# Patient Record
Sex: Male | Born: 1955 | Race: White | Hispanic: No | Marital: Single | Smoking: Former smoker
Health system: Southern US, Community
[De-identification: ages and names within clinical notes are randomized; demographics above are authoritative.]

---

## 2020-06-18 ENCOUNTER — Emergency Department (HOSPITAL_COMMUNITY)
Admission: EM | Admit: 2020-06-18 | Discharge: 2020-06-18 | Disposition: A | Discharge status: Home / Self Care | Payer: Managed Care, Other (non HMO) | Attending: Emergency Medicine | Admitting: Emergency Medicine

## 2020-06-18 ENCOUNTER — Other Ambulatory Visit: Payer: Self-pay

## 2020-06-18 ENCOUNTER — Encounter (HOSPITAL_COMMUNITY): Payer: Self-pay

## 2020-06-18 DIAGNOSIS — Z5321 Procedure and treatment not carried out due to patient leaving prior to being seen by health care provider: Secondary | ICD-10-CM | POA: Insufficient documentation

## 2020-06-18 DIAGNOSIS — R339 Retention of urine, unspecified: Secondary | ICD-10-CM | POA: Insufficient documentation

## 2020-06-18 DIAGNOSIS — R300 Dysuria: Secondary | ICD-10-CM | POA: Insufficient documentation

## 2020-06-18 DIAGNOSIS — R109 Unspecified abdominal pain: Secondary | ICD-10-CM | POA: Insufficient documentation

## 2020-06-18 LAB — CBC
HCT: 38 % — ABNORMAL LOW (ref 39.0–52.0)
Hemoglobin: 12.9 g/dL — ABNORMAL LOW (ref 13.0–17.0)
MCH: 30.4 pg (ref 26.0–34.0)
MCHC: 33.9 g/dL (ref 30.0–36.0)
MCV: 89.6 fL (ref 80.0–100.0)
Platelets: 300 10*3/uL (ref 150–400)
RBC: 4.24 MIL/uL (ref 4.22–5.81)
RDW: 12.9 % (ref 11.5–15.5)
WBC: 8.9 10*3/uL (ref 4.0–10.5)
nRBC: 0 % (ref 0.0–0.2)

## 2020-06-18 LAB — URINALYSIS, ROUTINE W REFLEX MICROSCOPIC
Bilirubin Urine: NEGATIVE
Glucose, UA: NEGATIVE mg/dL
Hgb urine dipstick: NEGATIVE
Ketones, ur: NEGATIVE mg/dL
Nitrite: NEGATIVE
Protein, ur: 100 mg/dL — AB
Specific Gravity, Urine: 1.02 (ref 1.005–1.030)
WBC, UA: >50 WBC/hpf — ABNORMAL HIGH (ref 0–5)
pH: 5 (ref 5.0–8.0)

## 2020-06-18 LAB — BASIC METABOLIC PANEL
Anion gap: 9 (ref 5–15)
BUN: 16 mg/dL (ref 8–23)
CO2: 25 mmol/L (ref 22–32)
Calcium: 9.1 mg/dL (ref 8.9–10.3)
Chloride: 107 mmol/L (ref 98–111)
Creatinine, Ser: 1.26 mg/dL — ABNORMAL HIGH (ref 0.61–1.24)
GFR, Estimated: >60 mL/min (ref >60)
Glucose, Bld: 137 mg/dL — ABNORMAL HIGH (ref 70–99)
Potassium: 4.3 mmol/L (ref 3.5–5.1)
Sodium: 141 mmol/L (ref 135–145)

## 2020-06-18 NOTE — ED Triage Notes (Signed)
Pt reports 1 week of dysuria and rentention, pt only able to get out dribbles of urine at a time, pt also reports groin tenderness, has apt for a prostate exam later this month

## 2020-06-18 NOTE — ED Notes (Signed)
Pt stated that "6 hours is long enough." Pt visualized walking out and leaving."

## 2020-06-19 ENCOUNTER — Ambulatory Visit (HOSPITAL_COMMUNITY)
Admission: RE | Admit: 2020-06-19 | Discharge: 2020-06-19 | Disposition: A | Discharge status: Home / Self Care | Payer: Managed Care, Other (non HMO) | Source: Ambulatory Visit | Attending: Urgent Care | Admitting: Urgent Care

## 2020-06-19 ENCOUNTER — Other Ambulatory Visit: Payer: Self-pay

## 2020-06-19 ENCOUNTER — Encounter (HOSPITAL_COMMUNITY): Payer: Self-pay

## 2020-06-19 VITALS — BP 122/70 | HR 79 | Temp 98.5°F | Resp 18

## 2020-06-19 DIAGNOSIS — N3001 Acute cystitis with hematuria: Secondary | ICD-10-CM

## 2020-06-19 DIAGNOSIS — R339 Retention of urine, unspecified: Secondary | ICD-10-CM | POA: Diagnosis not present

## 2020-06-19 LAB — POCT URINALYSIS DIPSTICK, ED / UC
Bilirubin Urine: NEGATIVE
Glucose, UA: NEGATIVE mg/dL
Ketones, ur: NEGATIVE mg/dL
Nitrite: NEGATIVE
Protein, ur: 100 mg/dL — AB
Specific Gravity, Urine: >=1.03 (ref 1.005–1.030)
Urobilinogen, UA: 0.2 mg/dL (ref 0.0–1.0)
pH: 6 (ref 5.0–8.0)

## 2020-06-19 MED ORDER — NITROFURANTOIN MONOHYD MACRO 100 MG PO CAPS: 100.0000 mg | ORAL_CAPSULE | Freq: Two times a day (BID) | ORAL | 0 refills | Status: DC

## 2020-06-19 NOTE — ED Provider Notes (Signed)
[place]    CSN: [id number] Arrival date & time: 06/19/20  1105      History   Chief Complaint Chief Complaint  Patient presents with  . Urinary Retention    HPI Nathan Pearson is a 65 y.o. male presenting with urinary retention x1 week. He denies history of BPH though he endorses one year of weak stream and family history of BPH.  Patient endorses 1 week of increased difficulty with urination and pressure over his bladder with urinating.  He endorses sensation of incomplete voiding.  Denies other UTI symptoms including dysuria hematuria frequency urgency back pain fevers/chills. Denies back pain. Denies new partners/STI risk.  HPI  History reviewed. No pertinent past medical history.       Home Medications    Prior to Admission medications   Medication Sig Start Date End Date Taking? Authorizing Provider  nitrofurantoin, macrocrystal-monohydrate, (MACROBID) 100 MG capsule Take 1 capsule (100 mg total) by mouth 2 (two) times daily. 06/19/20  Yes Hazel Sams, PA-C    Family History History reviewed. No pertinent family history.  Social History     Allergies   Penicillins   Review of Systems Review of Systems  Constitutional: Negative for appetite change, chills, diaphoresis and fever.  Respiratory: Negative for shortness of breath.   Cardiovascular: Negative for chest pain.  Gastrointestinal: Negative for abdominal pain, blood in stool, constipation, diarrhea, nausea and vomiting.  Genitourinary: Positive for difficulty urinating. Negative for decreased urine volume, dysuria, enuresis, flank pain, frequency, genital sores, hematuria, penile discharge, penile pain, penile swelling, scrotal swelling, testicular pain and urgency.  Musculoskeletal: Negative for back pain.  Neurological: Negative for dizziness, weakness and light-headedness.  All other systems reviewed and are negative.    Physical Exam Triage Vital Signs ED Triage Vitals  Enc  Vitals Group     BP 06/19/20 1143 122/70     Pulse Rate 06/19/20 1143 79     Resp 06/19/20 1143 18     Temp 06/19/20 1143 98.5 F (36.9 C)     Temp src --      SpO2 06/19/20 1143 99 %     Weight --      Height --      Head Circumference --      Peak Flow --      Pain Score 06/19/20 1144 7     Pain Loc --      Pain Edu? --      Excl. in [place]? --    No data found.  Updated Vital Signs BP 122/70 (BP Location: Left Arm)   Pulse 79   Temp 98.5 F (36.9 C)   Resp 18   SpO2 99%   Visual Acuity Right Eye Distance:   Left Eye Distance:   Bilateral Distance:    Right Eye Near:   Left Eye Near:    Bilateral Near:     Physical Exam Vitals reviewed.  Constitutional:      General: He is not in acute distress.    Appearance: Normal appearance. He is not ill-appearing.  HENT:     Head: Normocephalic and atraumatic.  Cardiovascular:     Rate and Rhythm: Normal rate and regular rhythm.     Heart sounds: Normal heart sounds.  Pulmonary:     Effort: Pulmonary effort is normal.     Breath sounds: Normal breath sounds.  Abdominal:     General: Bowel sounds are normal. There is no distension.  Palpations: Abdomen is soft. There is no mass.     Tenderness: There is no abdominal tenderness. There is no right CVA tenderness, left CVA tenderness, guarding or rebound. Negative signs include Murphy's sign, Rovsing's sign and McBurney's sign.  Neurological:     General: No focal deficit present.     Mental Status: He is alert and oriented to person, place, and time. Mental status is at baseline.  Psychiatric:        Mood and Affect: Mood normal.        Behavior: Behavior normal.        Thought Content: Thought content normal.        Judgment: Judgment normal.      UC Treatments / Results  Labs (all labs ordered are listed, but only abnormal results are displayed) Labs Reviewed  POCT URINALYSIS DIPSTICK, ED / UC - Abnormal; Notable for the following components:      Result  Value   Hgb urine dipstick MODERATE (*)    Protein, ur 100 (*)    Leukocytes,Ua LARGE (*)    All other components within normal limits  URINE CULTURE    EKG   Radiology No results found.  Procedures Procedures (including critical care time)  Medications Ordered in UC Medications - No data to display  Initial Impression / Assessment and Plan / UC Course  I have reviewed the triage vital signs and the nursing notes.  Pertinent labs & imaging results that were available during my care of the patient were reviewed by me and considered in my medical decision making (see chart for details).     UA with moderate blood , large leuk. Plan to treat for UTI with macrobid as below. Culture sent.  Patient already has appt with PCP to evaluate possible BPH; follow-up with them as scholed.  Return precautions- chest pain, shortness of breath, new/worsening fevers/chills, confusion, worsening of symptoms despite the above treatment plan, etc.   Spent over 30 minutes obtaining H&P, performing physical, discussing results, treatment plan and plan for follow-up with patient. Patient agrees with plan.      Final Clinical Impressions(s) / UC Diagnoses   Final diagnoses:  Acute cystitis with hematuria     Discharge Instructions     -Macrobid twice daily for 5 days.  -We'll call you if we need to change the antibiotic.  -Come back and see [place] if your symptoms worsen or persist despite treatment- like new/worsening fevers, worsening abdominal pain, back pain, etc.     ED Prescriptions    Medication Sig Dispense Auth. Provider   nitrofurantoin, macrocrystal-monohydrate, (MACROBID) 100 MG capsule Take 1 capsule (100 mg total) by mouth 2 (two) times daily. 10 capsule Hazel Sams, PA-C     PDMP not reviewed this encounter.   Hazel Sams, PA-C 06/19/20 1326

## 2020-06-19 NOTE — Discharge Instructions (Signed)
-  Macrobid twice daily for 5 days.  -We'll call you if we need to change the antibiotic.  -Come back and see [place] if your symptoms worsen or persist despite treatment- like new/worsening fevers, worsening abdominal pain, back pain, etc.

## 2020-06-19 NOTE — ED Triage Notes (Signed)
Pt present unable to urinate, pt states when he goes it aches and he is not emptying his bladder completely with some pressure.

## 2020-06-21 ENCOUNTER — Encounter (HOSPITAL_COMMUNITY): Payer: Self-pay

## 2020-06-21 LAB — URINE CULTURE: Culture: >=100000 — AB

## 2020-08-05 ENCOUNTER — Other Ambulatory Visit: Payer: Self-pay | Admitting: Otolaryngology

## 2020-08-05 DIAGNOSIS — R221 Localized swelling, mass and lump, neck: Secondary | ICD-10-CM

## 2020-08-19 ENCOUNTER — Other Ambulatory Visit: Payer: Self-pay

## 2020-08-19 ENCOUNTER — Encounter: Payer: Self-pay | Admitting: Pulmonary Disease

## 2020-08-19 ENCOUNTER — Ambulatory Visit: Payer: Managed Care, Other (non HMO) | Admitting: Pulmonary Disease

## 2020-08-19 VITALS — BP 122/74 | HR 84 | Temp 97.6°F | Ht 70.0 in | Wt 195.8 lb

## 2020-08-19 DIAGNOSIS — R0600 Dyspnea, unspecified: Secondary | ICD-10-CM

## 2020-08-19 DIAGNOSIS — R059 Cough, unspecified: Secondary | ICD-10-CM | POA: Diagnosis not present

## 2020-08-19 DIAGNOSIS — R0609 Other forms of dyspnea: Secondary | ICD-10-CM

## 2020-08-19 NOTE — Progress Notes (Signed)
@Patient  ID: Nathan Pearson, male    DOB: [birth year], 65 y.o.   MRN: [id number]  Chief Complaint  Patient presents with  . Consult    Covid 2020, Feb 2021 got first vaccine and started losing voice.    Referring provider: No ref. provider found  HPI:   65 year old whom we are seeing for evaluation of voice change, dyspnea exertion.  ENT note reviewed.  Patient has got Covid vaccine fall 2021.  About 2 weeks later he noticed voice change.  Smoking is made his voice deeper but there is been noticeable difference in quality to his voice at that time.  He was seen by ENT 04/2020.  They did a laryngoscopy fiberoptic exam which showed normal vocal cord function, no nodules, masses, etc.  He reported back a second time and a second look showed similar findings.  No explanation for voice changes.  This visit prompted a CT neck with contrast scheduled for next week.  He does endorse a cough.  Is intermittent.  Made it worse in the morning.  Producing sputum.  Feels like it comes from his chest.  No alleviating or exacerbating factors.  No seasonal or environmental changes.  His internal exertion that been present for some time but he thinks a bit worse since onset of voice changes as above.  Worse with inclines or stairs.  Relieved with rest.  No timing during day were better or worse.  No seasonal environmental changes.  No alleviating or exacerbating factors of the identified.  PMH: BPH Surgical history: Reviewed, he denies any surgeries Family history: Reviewed with patient, significant respiratory issues in first-degree relatives Social street: Former smoker, 40 pack years, quit November 2021, works as a Personal assistant, grew up in [place] / Pulmonary Flowsheets:   ACT:  No flowsheet data found.  MMRC: No flowsheet data found.  Epworth:  No flowsheet data found.  Tests:   FENO:  No results found for: NITRICOXIDE  PFT: No flowsheet data found.  WALK:   No flowsheet data found.  Imaging: n/a  Lab Results: Personally reviewed, borderline hemoglobin but no explanation for dyspnea CBC    Component Value Date/Time   WBC 8.9 06/18/2020 1445   RBC 4.24 06/18/2020 1445   HGB 12.9 (L) 06/18/2020 1445   HCT 38.0 (L) 06/18/2020 1445   PLT 300 06/18/2020 1445   MCV 89.6 06/18/2020 1445   MCH 30.4 06/18/2020 1445   MCHC 33.9 06/18/2020 1445   RDW 12.9 06/18/2020 1445    BMET    Component Value Date/Time   NA 141 06/18/2020 1445   K 4.3 06/18/2020 1445   CL 107 06/18/2020 1445   CO2 25 06/18/2020 1445   GLUCOSE 137 (H) 06/18/2020 1445   BUN 16 06/18/2020 1445   CREATININE 1.26 (H) 06/18/2020 1445   CALCIUM 9.1 06/18/2020 1445   GFRNONAA >60 06/18/2020 1445    BNP No results found for: BNP  ProBNP No results found for: PROBNP  Specialty Problems   None     Allergies  Allergen Reactions  . Penicillins      There is no immunization history on file for this patient.  History reviewed. No pertinent past medical history.  Tobacco History: Social History   Tobacco Use  Smoking Status Former Smoker  . Quit date: 03/2020  . Years since quitting: 0.4  Smokeless Tobacco Never Used   Counseling given: No   Continue to not smoke  Outpatient Encounter Medications as of  08/19/2020  Medication Sig  . ipratropium (ATROVENT) 0.06 % nasal spray SMARTSIG:2 Spray(s) Both Nares Twice Daily PRN  . nitrofurantoin, macrocrystal-monohydrate, (MACROBID) 100 MG capsule Take 1 capsule (100 mg total) by mouth 2 (two) times daily.  . tamsulosin (FLOMAX) 0.4 MG CAPS capsule Take 0.4 mg by mouth daily.   No facility-administered encounter medications on file as of 08/19/2020.     Review of Systems  Review of Systems  No chest pain exertion.  No orthopnea or PND.  Comprehensive review of systems otherwise negative Physical Exam  BP 122/74 (BP Location: Left Arm, Cuff Size: Normal)   Pulse 84   Temp 97.6 F (36.4 C) (Oral)    Ht 5\' 10"  (1.778 m)   Wt 195 lb 12.8 oz (88.8 kg)   SpO2 94%   BMI 28.09 kg/m   Wt Readings from Last 5 Encounters:  08/19/20 195 lb 12.8 oz (88.8 kg)    BMI Readings from Last 5 Encounters:  08/19/20 28.09 kg/m     Physical Exam General: Well-appearing, no acute distress Eyes: EOMI, no gross Neck: Supple, no JVP Neuro: Cranial nerves intact, muffled almost a lisp like voice is deep, mild dysphasia, maybe mild droop on the left but no nasolabial fold flattening or other abnormality of facial nerve Pulmonary: Distant breath sounds, clear to auscultate bilaterally Cardiovascular: frequent PVCs, regular rate, warm Abdomen: Nondistended, bowel sounds present MSK: No synovitis, effusion Psych: Normal mood, full affect   Assessment & Plan:   Voice change: About 2 weeks after COVID-19 vaccination.  Has seen ENT x2 with vocal cords reportedly normal per patient report.  Reviewed ENT note 12/27 confirms this.  Investigate extrinsic issues with CT neck in coming days.  Do worry a bit about stroke given the sound of his voice but would have to be extremely focal, lacunar given no other deficits seen on cranial nerve or other exam.  Dyspnea exertion: Possibilities of vocal cord issues but this seems to be ruled out by ENT evaluation x2.  He has a 25-30-pack-year smoking history.  Distant breath sounds.  High suspicion for early emphysema/COPD.  Will obtain PFTs in the coming days.  In addition, CT chest ordered given sputum production as well as voice change, evaluate potential underlying mass or other structural issue below the vocal cords.   Return in about 6 weeks (around 09/30/2020).   Lanier Clam, MD 08/19/2020

## 2020-08-19 NOTE — Patient Instructions (Addendum)
Nice to meet you  I think I need some more information to decide how best to help you  To start this, I have asked for a CT scan of your chest.  Hopefully this can be added to the already scheduled CT scan of your neck on 08/23/2020.  In addition, I ordered breathing or pulmonary function test.  This will give me insight into other reasons you may be short of breath.  I do worry a little bit of emphysema and COPD given your cigarette smoking and your exam today.  But I cannot diagnose any of this without the above tests.  Return to clinic in 6 weeks with Dr. Silas Flood  Schedule PFTs next available at patient convenience

## 2020-08-23 ENCOUNTER — Ambulatory Visit
Admission: RE | Admit: 2020-08-23 | Discharge: 2020-08-23 | Disposition: A | Discharge status: Home / Self Care | Payer: Managed Care, Other (non HMO) | Source: Ambulatory Visit | Attending: Otolaryngology | Admitting: Otolaryngology

## 2020-08-23 ENCOUNTER — Telehealth: Payer: Self-pay | Admitting: Pulmonary Disease

## 2020-08-23 ENCOUNTER — Other Ambulatory Visit: Payer: Managed Care, Other (non HMO)

## 2020-08-23 ENCOUNTER — Other Ambulatory Visit: Payer: Self-pay

## 2020-08-23 DIAGNOSIS — R221 Localized swelling, mass and lump, neck: Secondary | ICD-10-CM

## 2020-08-23 DIAGNOSIS — R059 Cough, unspecified: Secondary | ICD-10-CM

## 2020-08-23 MED ORDER — IOPAMIDOL (ISOVUE-300) INJECTION 61%
75.0000 mL | Freq: Once | INTRAVENOUS | Status: AC | PRN
  Administered 2020-08-23: 75 mL via INTRAVENOUS

## 2020-08-23 NOTE — Telephone Encounter (Signed)
I had called to set up a Peer to Peer and this is what I was told Case [id number]

## 2020-08-23 NOTE — Telephone Encounter (Signed)
Mh please advise. Thanks

## 2020-08-23 NOTE — Telephone Encounter (Signed)
Vallarie Mare please advise on the number that Dr. Silas Flood can call to do the Peer to Peer.  thanks

## 2020-08-23 NOTE — Telephone Encounter (Signed)
What is the peer to peer for? I can likely call tomorrow. What is the number to call?

## 2020-08-24 NOTE — Telephone Encounter (Signed)
When I called to do it they said a peer to peer would not help it would still be denied they need a chest xray result and a pft result before it would be considered I have the number I called was [phone] opt 4. The case # is [id number]

## 2020-08-24 NOTE — Telephone Encounter (Signed)
Routing to Dr. Silas Flood

## 2020-08-25 NOTE — Telephone Encounter (Signed)
OK can we order a CXR and check on status of PFT schedule since need both before can pursue a CT chest?

## 2020-08-25 NOTE — Telephone Encounter (Signed)
I have called the pt and he is coming tomorrow for his cxr and he is scheduled for 04/29 for the PFT.  He will be going on 04/26 to get his covid test.  Pt is aware. FYI for MH.

## 2020-08-26 ENCOUNTER — Ambulatory Visit (INDEPENDENT_AMBULATORY_CARE_PROVIDER_SITE_OTHER): Payer: Managed Care, Other (non HMO)

## 2020-08-26 DIAGNOSIS — R059 Cough, unspecified: Secondary | ICD-10-CM

## 2020-09-07 ENCOUNTER — Other Ambulatory Visit (HOSPITAL_COMMUNITY)
Admission: RE | Admit: 2020-09-07 | Discharge: 2020-09-07 | Disposition: A | Discharge status: Home / Self Care | Payer: Managed Care, Other (non HMO) | Source: Ambulatory Visit | Attending: Pulmonary Disease | Admitting: Pulmonary Disease

## 2020-09-07 DIAGNOSIS — Z20822 Contact with and (suspected) exposure to covid-19: Secondary | ICD-10-CM | POA: Insufficient documentation

## 2020-09-07 DIAGNOSIS — Z01812 Encounter for preprocedural laboratory examination: Secondary | ICD-10-CM | POA: Diagnosis present

## 2020-09-08 LAB — SARS CORONAVIRUS 2 (TAT 6-24 HRS): SARS Coronavirus 2: NEGATIVE

## 2020-09-10 ENCOUNTER — Other Ambulatory Visit: Payer: Self-pay

## 2020-09-10 ENCOUNTER — Ambulatory Visit (INDEPENDENT_AMBULATORY_CARE_PROVIDER_SITE_OTHER): Payer: Managed Care, Other (non HMO) | Admitting: Pulmonary Disease

## 2020-09-10 DIAGNOSIS — R059 Cough, unspecified: Secondary | ICD-10-CM

## 2020-10-05 LAB — PULMONARY FUNCTION TEST
DL/VA % pred: 108 %
DL/VA: 4.51 ml/min/mmHg/L
DLCO cor % pred: 69 %
DLCO cor: 18.05 ml/min/mmHg
DLCO unc % pred: 69 %
DLCO unc: 18.05 ml/min/mmHg
FEF 25-75 Post: 0.91 L/sec
FEF 25-75 Pre: 0.86 L/sec
FEF2575-%Change-Post: 6 %
FEF2575-%Pred-Post: 35 %
FEF2575-%Pred-Pre: 33 %
FEV1-%Change-Post: 0 %
FEV1-%Pred-Post: 43 %
FEV1-%Pred-Pre: 43 %
FEV1-Post: 1.43 L
FEV1-Pre: 1.42 L
FEV1FVC-%Change-Post: 4 %
FEV1FVC-%Pred-Pre: 90 %
FEV6-%Change-Post: -5 %
FEV6-%Pred-Post: 47 %
FEV6-%Pred-Pre: 50 %
FEV6-Post: 1.99 L
FEV6-Pre: 2.1 L
FEV6FVC-%Change-Post: -1 %
FEV6FVC-%Pred-Post: 103 %
FEV6FVC-%Pred-Pre: 105 %
FVC-%Change-Post: -3 %
FVC-%Pred-Post: 46 %
FVC-%Pred-Pre: 47 %
FVC-Post: 2.03 L
FVC-Pre: 2.11 L
Post FEV1/FVC ratio: 70 %
Post FEV6/FVC ratio: 98 %
Pre FEV1/FVC ratio: 67 %
Pre FEV6/FVC Ratio: 100 %

## 2020-10-20 ENCOUNTER — Other Ambulatory Visit (HOSPITAL_COMMUNITY): Payer: Self-pay | Admitting: Urology

## 2020-10-20 ENCOUNTER — Other Ambulatory Visit: Payer: Self-pay | Admitting: Urology

## 2020-10-20 DIAGNOSIS — C61 Malignant neoplasm of prostate: Secondary | ICD-10-CM

## 2020-10-29 NOTE — Progress Notes (Addendum)
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 Reported urinary frequency, intermittency, hesitancy, weak stream, straining to void, and gross hematuria. In 07/2020 he reported his symptoms improved after starting Flomax, and CT IVP was negative for malignancy.   Biopsies revealed:  09/24/2020   Past/Anticipated interventions by urology, if any:  09/24/2020 Dr. Link Snuffer Transrectal ultrasound and prostate biopsy  Past/Anticipated interventions by medical oncology, if any:  No referral placed at this time  IPSS Score: 18 (moderate) SHIM Score: 9 (moderate)  Weight changes, if any: Reports an unintentional weight loss ~10 lbs over the past few months. States it seems related to the adverse reaction he experienced after the receiving the first Pfizer vaccine (change in vocal tone/strength, and difficulty chewing/swallowing). Performed a pharyngeal function test on 10/28/2020 at [facility]  Bowel/Bladder complaints, if any: Patient denies. Reports he has regular, daily bowel movements.    Nausea/Vomiting, if any: Patient denies  Pain issues, if any:  Patient denies  SAFETY ISSUES: Prior radiation? No Pacemaker/ICD? No Possible current pregnancy? N/A Is the patient on methotrexate? No  Current Complaints / other details:  Scheduled for a neurology evaluation on 11/11/2020.

## 2020-10-31 NOTE — Progress Notes (Signed)
Radiation Oncology         [phone] ________________________________  Initial Outpatient Consultation  Name: Nathan Pearson MRN: [id number]  Date: 11/01/2020  DOB: 09/11/1955  CC:Pcp, No  Nathan Pearson Nathan Hane, MD   REFERRING PHYSICIAN: Lucas Mallow, MD  DIAGNOSIS: 65 y.o. 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    ICD-10-CM   1. Malignant neoplasm of prostate ([place])  C61       HISTORY OF PRESENT ILLNESS: Nathan Pearson is a 65 y.o. male with a diagnosis of prostate cancer. He initially presented to urgent care with LUTS and hematuria. He was found to have a UTI and started on Macrobid. Accordingly, he was referred for evaluation in urology by Dr. Gloriann Pearson on 07/09/20.  PSA performed that day showed elevation at 5.04. He underwent CT A/P on 07/23/20 to evaluate for hematuria showing: no evidence urinary tract mass, calculus, filling defect, or hydronephrosis; mild prostatomegaly; no enlarged lymph nodes. Repeat PSA on 08/05/20 showed a slight decrease to 4.38. Cystoscopy performed at follow up on 08/12/20 showed only an obstructing prostate with mild hyperplasia. The patient proceeded to transrectal ultrasound with 12 biopsies of the prostate on 09/24/20.  The prostate volume measured 65 cc.  Out of 12 core biopsies, all 12 were positive.  The maximum Gleason score was 4+4, and this was seen in left apex lateral. Additionally, Gleason 4+3 was seen in left base and left mid, Gleason 3+4 in right apex lateral (small focus), left mid lateral, and left mid lateral, and Gleason 3+3 in left apex (small focus), right base (small focus), right mid (small focus), right apex, right base lateral, and right mid lateral.  He is scheduled for bone scan on 11/02/20.  It should be noted that concurrent with his work-up for prostate cancer, the patient has also developed profound dysarthria.  The onset seemed to correlate with his first COVID  vaccine.  He has seen ENT at [facility] and had barium swallow.  The ENT referred him to neurology for consideration of a central neurologic etiology including possible ALS.  He meets with neurology on 11/11/20.  The patient reviewed the biopsy results with his urologist and he has kindly been referred today for discussion of potential radiation treatment options.   PREVIOUS RADIATION THERAPY: No  PAST MEDICAL HISTORY: No past medical history on file.    PAST SURGICAL HISTORY:No past surgical history on file.  FAMILY HISTORY: No family history on file.  SOCIAL HISTORY:  Social History   Socioeconomic History   Marital status: Single    Spouse name: Not on file   Number of children: Not on file   Years of education: Not on file   Highest education level: Not on file  Occupational History   Not on file  Tobacco Use   Smoking status: Former    Pack years: 0.00    Types: Cigarettes    Quit date: 03/2020    Years since quitting: 0.6   Smokeless tobacco: Never  Substance and Sexual Activity   Alcohol use: Not on file   Drug use: Not on file   Sexual activity: Not on file  Other Topics Concern   Not on file  Social History Narrative   ** Merged History Encounter **       Social Determinants of Health   Financial Resource Strain: Not on file  Food Insecurity: Not on file  Transportation Needs:  Not on file  Physical Activity: Not on file  Stress: Not on file  Social Connections: Not on file  Intimate Partner Violence: Not on file    ALLERGIES: Penicillins  MEDICATIONS:  Current Outpatient Medications  Medication Sig Dispense Refill   tamsulosin (FLOMAX) 0.4 MG CAPS capsule Take 0.4 mg by mouth daily.     ipratropium (ATROVENT) 0.06 % nasal spray SMARTSIG:2 Spray(s) Both Nares Twice Daily PRN (Patient not taking: Reported on 11/01/2020)     nitrofurantoin, macrocrystal-monohydrate, (MACROBID) 100 MG capsule Take 1 capsule (100 mg total) by mouth 2 (two) times daily.  (Patient not taking: Reported on 11/01/2020) 10 capsule 0   No current facility-administered medications for this encounter.    REVIEW OF SYSTEMS:  On review of systems, the patient reports that he is doing well overall. He denies any chest pain, shortness of breath, cough, fevers, chills, night sweats, unintended weight changes. He denies any bowel disturbances, and denies abdominal pain, nausea or vomiting. He denies any new musculoskeletal or joint aches or pains. His IPSS was 18, indicating severe urinary symptoms. His SHIM was 6, indicating he does have moderate to severe erectile dysfunction. A complete review of systems is obtained and is otherwise negative.    PHYSICAL EXAM:  Wt Readings from Last 3 Encounters:  11/01/20 183 lb (83 kg)  08/19/20 195 lb 12.8 oz (88.8 kg)   Temp Readings from Last 3 Encounters:  11/01/20 97.9 F (36.6 C)  08/19/20 97.6 F (36.4 C) (Oral)  06/19/20 98.5 F (36.9 C)   BP Readings from Last 3 Encounters:  11/01/20 123/70  08/19/20 122/74  06/19/20 122/70   Pulse Readings from Last 3 Encounters:  11/01/20 77  08/19/20 84  06/19/20 79   Pain Assessment Pain Score: 0-No pain/10  In general this is a well appearing man in no acute distress. He's alert and oriented x4 and appropriate throughout the examination.  He is profoundly dysarthric but otherwise neuro grossly intact. Cardiopulmonary assessment is negative for acute distress, and he exhibits normal effort.     KPS = 100  100 - Normal; no complaints; no evidence of disease. 90   - Able to carry on normal activity; minor signs or symptoms of disease. 80   - Normal activity with effort; some signs or symptoms of disease. 20   - Cares for self; unable to carry on normal activity or to do active work. 60   - Requires occasional assistance, but is able to care for most of his personal needs. 50   - Requires considerable assistance and frequent medical care. 66   - Disabled; requires special  care and assistance. 15   - Severely disabled; hospital admission is indicated although death not imminent. 64   - Very sick; hospital admission necessary; active supportive treatment necessary. 10   - Moribund; fatal processes progressing rapidly. 0     - Dead  Karnofsky DA, Abelmann Samak, Craver LS and Burchenal [facility] [phone]) The use of the nitrogen mustards in the palliative treatment of carcinoma: with particular reference to bronchogenic carcinoma Cancer 1 634-56  LABORATORY DATA:  Lab Results  Component Value Date   WBC 8.9 06/18/2020   HGB 12.9 (L) 06/18/2020   HCT 38.0 (L) 06/18/2020   MCV 89.6 06/18/2020   PLT 300 06/18/2020   Lab Results  Component Value Date   NA 141 06/18/2020   K 4.3 06/18/2020   CL 107 06/18/2020   CO2 25 06/18/2020   No results  found for: ALT, AST, GGT, ALKPHOS, BILITOT   RADIOGRAPHY: No results found.    IMPRESSION/PLAN: 1. 65 y.o. gentleman with Stage T1c adenocarcinoma of the prostate with Gleason Score of 4+4, and PSA of 4.38. We discussed the patient's workup and outlined the nature of prostate cancer in this setting. The patient's T stage, Gleason's score, and PSA put him into the high risk group. Accordingly, he is eligible for a variety of potential treatment options including ADT in combination with 8 weeks of external radiation, 5 weeks of external radiation with an upfront brachytherapy boost, or prostatectomy. We discussed the available radiation techniques, and focused on the details and logistics of delivery. The patient may not be an ideal candidate for brachytherapy boost with a prostate volume of 65 cc prior to downsizing from hormone therapy. We discussed that based on his prostate volume, he would require beginning treatment with a 5 alpha reductase inhibitor and ADT for at least 3 months to allow for downsizing of the prostate prior to initiating radiotherapy. We discussed and outlined the risks, benefits, short and long-term effects  associated with radiotherapy and compared and contrasted these with prostatectomy. We discussed the role of SpaceOAR gel in reducing the rectal toxicity associated with radiotherapy. We also detailed the role of ADT in the treatment of high risk prostate cancer and outlined the associated side effects that could be expected with this therapy. He appears to have a good understanding of his disease and our treatment recommendations which are of curative intent.  He was encouraged to ask questions that were answered to his stated satisfaction.  At the conclusion of our conversation, the patient is most interested in moving forward with prostatectomy, pending his neurology evaluation.   I personally spent 60 minutes in this encounter including chart review, reviewing radiological studies, meeting face-to-face with the patient, entering orders and completing documentation.    ------------------------------------------------   Tyler Pita, MD [place]: [phone]  Fax: [phone] .com  Skype  [email or web address]   This document serves as a record of services personally performed by Tyler Pita, MD. It was created on his behalf by Wilburn Mylar, a trained medical scribe. The creation of this record is based on the scribe's personal observations and the provider's statements to them. This document has been checked and approved by the attending provider.

## 2020-11-01 ENCOUNTER — Ambulatory Visit
Admission: RE | Admit: 2020-11-01 | Discharge: 2020-11-01 | Disposition: A | Discharge status: Home / Self Care | Payer: Managed Care, Other (non HMO) | Source: Ambulatory Visit | Attending: Radiation Oncology | Admitting: Radiation Oncology

## 2020-11-01 ENCOUNTER — Other Ambulatory Visit: Payer: Self-pay

## 2020-11-01 VITALS — BP 123/70 | HR 77 | Temp 97.9°F | Resp 18 | Ht 70.0 in | Wt 183.0 lb

## 2020-11-01 DIAGNOSIS — R471 Dysarthria and anarthria: Secondary | ICD-10-CM | POA: Diagnosis not present

## 2020-11-01 DIAGNOSIS — C61 Malignant neoplasm of prostate: Secondary | ICD-10-CM | POA: Insufficient documentation

## 2020-11-02 ENCOUNTER — Encounter (HOSPITAL_COMMUNITY)
Admission: RE | Admit: 2020-11-02 | Discharge: 2020-11-02 | Disposition: A | Discharge status: Home / Self Care | Payer: Managed Care, Other (non HMO) | Source: Ambulatory Visit | Attending: Urology | Admitting: Urology

## 2020-11-02 DIAGNOSIS — C61 Malignant neoplasm of prostate: Secondary | ICD-10-CM

## 2020-11-02 MED ORDER — TECHNETIUM TC 99M MEDRONATE IV KIT
20.0000 | PACK | Freq: Once | INTRAVENOUS | Status: AC | PRN
  Administered 2020-11-02: 20 via INTRAVENOUS

## 2020-11-11 LAB — VITAMIN B12: Vitamin B-12: 312

## 2020-11-11 LAB — TSH: TSH: 1.07 (ref <=5.90)

## 2020-11-12 ENCOUNTER — Telehealth: Payer: Self-pay | Admitting: Pulmonary Disease

## 2020-11-12 DIAGNOSIS — R0602 Shortness of breath: Secondary | ICD-10-CM

## 2020-11-12 MED ORDER — TRELEGY ELLIPTA 100-62.5-25 MCG/INH IN AEPB: 1.0000 | INHALATION_SPRAY | Freq: Every day | RESPIRATORY_TRACT | 5 refills | Status: DC

## 2020-11-12 NOTE — Telephone Encounter (Signed)
BW can you review the PFT that was done on 4/29 to see if this pt needs to go on Trelegy.  Dr. Vallarie Mare is calling and stated that the pt may have ALS and if this PFT was bad then he needs Trelegy.  Please advise. Thanks

## 2020-11-12 NOTE — Telephone Encounter (Signed)
Called and spoke with Dr. Vallarie Mare. Her stated that he was calling to see if MH could help to get the patient setup for a Triology machine, not Trelegy inhaler based on his PFTs. I advised him that I would send the message over to [facility] directly. He stated that the patient's increased SOB was not great enough for him to go the hospital but he would like to get something started soon. 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   I will call the pharmacy to cancel the RX.   MH, can you please advise? Thanks!

## 2020-11-12 NOTE — Telephone Encounter (Signed)
PFTs showed moderate-severe obstruction. Agree with trial Trelegy 156mcg one puff daily. I have sent in Rx.

## 2020-11-16 NOTE — Telephone Encounter (Signed)
Neurologist contacted concern for ALS. Will attempt to obtain Trilogy ventilator. If additional studies are needed will ask insurer what is needed and if reasonable order said studies.

## 2020-11-16 NOTE — Telephone Encounter (Signed)
Order has been placed.

## 2020-11-16 NOTE — Telephone Encounter (Signed)
I can place an order for the Trilogy but the DME company but will need the settings.

## 2020-11-18 LAB — PSA: PSA: 4.66

## 2020-11-24 ENCOUNTER — Telehealth: Payer: Self-pay | Admitting: Pulmonary Disease

## 2020-11-24 NOTE — Telephone Encounter (Signed)
New note/addendum from encounter 08/19/20 placed in chart.

## 2020-11-24 NOTE — Progress Notes (Signed)
Recent diagnosis of ALS. Recommend NIPPV at night to prevent clinical deterioration due to respiratory failure secondary to neuromuscular weakness. BiPAP is insufficient given lack of back up rate. As such, Trilogy ventilator ordered.

## 2020-11-24 NOTE — Telephone Encounter (Signed)
Pojoaque please advise if you can do an addendum to the OV notes per the request of [facility]APT.  Thanks

## 2020-11-25 NOTE — Telephone Encounter (Signed)
I spoke with Nathan Pearson and notified that addendum is done  She was already aware  Nothing further needed

## 2020-11-30 ENCOUNTER — Telehealth: Payer: Self-pay | Admitting: Pulmonary Disease

## 2020-11-30 NOTE — Telephone Encounter (Signed)
Called and spoke with patient scheduled him to see Dr. Silas Flood on 12/23/20 tat 4:30 pm to discuss Trilogy Vent.  Advised to arrive by 4:15 pm for check in.  He verbalized understanding.  Nothing further needed.

## 2020-11-30 NOTE — Telephone Encounter (Signed)
Can we get him scheduled for a f/u with me to discuss?

## 2020-11-30 NOTE — Telephone Encounter (Signed)
Called and spoke with Melissa from [place] who states that they received our order for Trilogy machine. She states that they called the patient to get him scheduled for an appointment to get him set up and the patient declined and hung up the phone. She states that she knows the patient was referred to [place] from his Neurologist after recent diagnosis of ALS and that he has deterioration due to respiratory failure. She is not sure how to go about getting the patient set up. She wanted to pass this on to Dr. Silas Flood and see if him or possibly his neurologist would want to call the patient to talk to him about it and how he needs it. Informed her that I would pass this along to Dr. Silas Flood and get his recommendations.   Dr. Silas Flood please advise

## 2020-12-23 ENCOUNTER — Ambulatory Visit: Payer: Managed Care, Other (non HMO) | Admitting: Pulmonary Disease

## 2021-01-31 ENCOUNTER — Other Ambulatory Visit: Payer: Self-pay

## 2021-02-01 ENCOUNTER — Ambulatory Visit: Payer: Managed Care, Other (non HMO) | Admitting: Internal Medicine

## 2021-02-01 ENCOUNTER — Encounter: Payer: Self-pay | Admitting: Internal Medicine

## 2021-02-01 VITALS — BP 126/72 | HR 90 | Temp 98.5°F | Resp 16 | Ht 70.0 in | Wt 170.0 lb

## 2021-02-01 DIAGNOSIS — L2084 Intrinsic (allergic) eczema: Secondary | ICD-10-CM

## 2021-02-01 DIAGNOSIS — Z0001 Encounter for general adult medical examination with abnormal findings: Secondary | ICD-10-CM

## 2021-02-01 DIAGNOSIS — R739 Hyperglycemia, unspecified: Secondary | ICD-10-CM | POA: Diagnosis not present

## 2021-02-01 DIAGNOSIS — E785 Hyperlipidemia, unspecified: Secondary | ICD-10-CM | POA: Insufficient documentation

## 2021-02-01 DIAGNOSIS — Z1211 Encounter for screening for malignant neoplasm of colon: Secondary | ICD-10-CM

## 2021-02-01 DIAGNOSIS — D539 Nutritional anemia, unspecified: Secondary | ICD-10-CM | POA: Diagnosis not present

## 2021-02-01 LAB — CBC WITH DIFFERENTIAL/PLATELET
Basophils Absolute: 0 10*3/uL (ref 0.0–0.1)
Basophils Relative: 0.6 % (ref 0.0–3.0)
Eosinophils Absolute: 0.1 10*3/uL (ref 0.0–0.7)
Eosinophils Relative: 2.6 % (ref 0.0–5.0)
HCT: 38 % — ABNORMAL LOW (ref 39.0–52.0)
Hemoglobin: 13 g/dL (ref 13.0–17.0)
Lymphocytes Relative: 18.3 % (ref 12.0–46.0)
Lymphs Abs: 1 10*3/uL (ref 0.7–4.0)
MCHC: 34.1 g/dL (ref 30.0–36.0)
MCV: 85.4 fl (ref 78.0–100.0)
Monocytes Absolute: 0.3 10*3/uL (ref 0.1–1.0)
Monocytes Relative: 5.3 % (ref 3.0–12.0)
Neutro Abs: 4.2 10*3/uL (ref 1.4–7.7)
Neutrophils Relative %: 73.2 % (ref 43.0–77.0)
Platelets: 235 10*3/uL (ref 150.0–400.0)
RBC: 4.45 Mil/uL (ref 4.22–5.81)
RDW: 13.4 % (ref 11.5–15.5)
WBC: 5.7 10*3/uL (ref 4.0–10.5)

## 2021-02-01 LAB — BASIC METABOLIC PANEL
BUN: 16 mg/dL (ref 6–23)
CO2: 29 mEq/L (ref 19–32)
Calcium: 9.3 mg/dL (ref 8.4–10.5)
Chloride: 108 mEq/L (ref 96–112)
Creatinine, Ser: 0.92 mg/dL (ref 0.40–1.50)
GFR: 86.62 mL/min (ref >60.00)
Glucose, Bld: 91 mg/dL (ref 70–99)
Potassium: 4.2 mEq/L (ref 3.5–5.1)
Sodium: 144 mEq/L (ref 135–145)

## 2021-02-01 LAB — VITAMIN B12: Vitamin B-12: 314 pg/mL (ref 211–911)

## 2021-02-01 LAB — LIPID PANEL
Cholesterol: 151 mg/dL (ref 0–200)
HDL: 51.7 mg/dL (ref >39.00)
NonHDL: 99.35
Total CHOL/HDL Ratio: 3
Triglycerides: 217 mg/dL — ABNORMAL HIGH (ref 0.0–149.0)
VLDL: 43.4 mg/dL — ABNORMAL HIGH (ref 0.0–40.0)

## 2021-02-01 LAB — IBC + FERRITIN
Ferritin: 42.3 ng/mL (ref 22.0–322.0)
Iron: 92 ug/dL (ref 42–165)
Saturation Ratios: 31.9 % (ref 20.0–50.0)
TIBC: 288.4 ug/dL (ref 250.0–450.0)
Transferrin: 206 mg/dL — ABNORMAL LOW (ref 212.0–360.0)

## 2021-02-01 LAB — LDL CHOLESTEROL, DIRECT: Direct LDL: 81 mg/dL

## 2021-02-01 LAB — HEMOGLOBIN A1C: Hgb A1c MFr Bld: 5.5 % (ref 4.6–6.5)

## 2021-02-01 LAB — FOLATE: Folate: >23.4 ng/mL (ref >5.9)

## 2021-02-01 MED ORDER — TRIAMCINOLONE ACETONIDE 0.5 % EX CREA: 1.0000 "application " | TOPICAL_CREAM | Freq: Three times a day (TID) | CUTANEOUS | 2 refills | Status: DC

## 2021-02-01 NOTE — Progress Notes (Signed)
Subjective:  Patient ID: Nathan Pearson, male    DOB: October 29, 1955  Age: 65 y.o. MRN: [id number]  CC: Annual Exam, Anemia, Hyperlipidemia, and Rash  This visit occurred during the SARS-CoV-2 public health emergency.  Safety protocols were in place, including screening questions prior to the visit, additional usage of staff PPE, and extensive cleaning of exam room while observing appropriate contact time as indicated for disinfecting solutions.    HPI Nathan Pearson presents for a CPX, f/up, and to establish.  He developed dysarthria about a year ago.  He thinks it was related to a COVID-19 vaccine.  This has been thoroughly evaluated and treated elsewhere.  He has had a several week history of rash on his lower extremities.  It is mildly itching and he has been treating it with an over-the-counter topical antibiotic ointment without much relief.  He is active and denies chest pain, shortness of breath, diaphoresis, dizziness, or lightheadedness.  History Mustafa has no past medical history on file.   He has no past surgical history on file.   His family history includes Lung cancer in his mother; Pneumonia in his father.He reports that he quit smoking about 10 months ago. His smoking use included cigarettes. He has never used smokeless tobacco. He reports that he does not currently use alcohol. He reports that he does not currently use drugs.  Outpatient Medications Prior to Visit  Medication Sig Dispense Refill   tamsulosin (FLOMAX) 0.4 MG CAPS capsule Take 0.4 mg by mouth daily.     ipratropium (ATROVENT) 0.06 % nasal spray  (Patient not taking: Reported on 02/01/2021)     nitrofurantoin, macrocrystal-monohydrate, (MACROBID) 100 MG capsule Take 1 capsule (100 mg total) by mouth 2 (two) times daily. (Patient not taking: Reported on 02/01/2021) 10 capsule 0   No facility-administered medications prior to visit.    ROS Review of Systems  Constitutional: Negative.  Negative for  appetite change, chills, diaphoresis, fatigue and unexpected weight change.  HENT: Negative.    Eyes: Negative.   Respiratory: Negative.  Negative for cough, shortness of breath and wheezing.   Cardiovascular:  Negative for chest pain, palpitations and leg swelling.  Gastrointestinal:  Negative for abdominal pain, constipation, diarrhea, nausea and vomiting.  Genitourinary: Negative.  Negative for difficulty urinating, scrotal swelling and testicular pain.  Musculoskeletal: Negative.  Negative for arthralgias and myalgias.  Skin:  Positive for color change and rash.  Neurological: Negative.  Negative for dizziness, weakness and light-headedness.  Hematological:  Negative for adenopathy. Does not bruise/bleed easily.  Psychiatric/Behavioral: Negative.     Objective:  BP 126/72 (BP Location: Left Arm, Patient Position: Sitting, Cuff Size: Large)   Pulse 90   Temp 98.5 F (36.9 C) (Oral)   Resp 16   Ht 5\' 10"  (1.778 m)   Wt 170 lb (77.1 kg)   SpO2 95%   BMI 24.39 kg/m   Physical Exam Vitals reviewed.  HENT:     Nose: Nose normal.     Mouth/Throat:     Mouth: Mucous membranes are moist.  Eyes:     Conjunctiva/sclera: Conjunctivae normal.  Cardiovascular:     Rate and Rhythm: Normal rate and regular rhythm.     Heart sounds: No murmur heard. Pulmonary:     Effort: Pulmonary effort is normal.     Breath sounds: No stridor. Examination of the right-middle field reveals rhonchi. Examination of the left-middle field reveals rhonchi. Examination of the right-lower field reveals rhonchi. Examination of the  left-lower field reveals rhonchi. Rhonchi present. No decreased breath sounds, wheezing or rales.  Chest:     Chest wall: No tenderness.  Abdominal:     General: Abdomen is flat.     Palpations: There is no mass.     Tenderness: There is no abdominal tenderness. There is no guarding.     Hernia: No hernia is present. There is no hernia in the left inguinal area or right inguinal  area.  Genitourinary:    Pubic Area: No rash.      Penis: Normal and circumcised.      Testes: Normal.     Epididymis:     Right: Normal.     Left: Normal.     Prostate: Enlarged. Not tender and no nodules present.     Rectum: Normal. Guaiac result negative. No mass, tenderness, anal fissure, external hemorrhoid or internal hemorrhoid. Normal anal tone.  Musculoskeletal:        General: Normal range of motion.     Cervical back: Neck supple.     Right lower leg: No edema.     Left lower leg: No edema.  Lymphadenopathy:     Cervical: No cervical adenopathy.     Lower Body: No right inguinal adenopathy. No left inguinal adenopathy.  Skin:    General: Skin is warm and dry.     Coloration: Skin is not jaundiced or pale.     Findings: Erythema and rash present. Rash is papular and scaling.     Comments: There are erythematous patches over both lower extremity.  They appear to be coalesced papules with scale.  Neurological:     General: No focal deficit present.     Mental Status: He is alert.  Psychiatric:        Mood and Affect: Mood normal.        Behavior: Behavior normal.    Lab Results  Component Value Date   WBC 5.7 02/01/2021   HGB 13.0 02/01/2021   HCT 38.0 (L) 02/01/2021   PLT 235.0 02/01/2021   GLUCOSE 91 02/01/2021   CHOL 151 02/01/2021   TRIG 217.0 (H) 02/01/2021   HDL 51.70 02/01/2021   LDLDIRECT 81.0 02/01/2021   NA 144 02/01/2021   K 4.2 02/01/2021   CL 108 02/01/2021   CREATININE 0.92 02/01/2021   BUN 16 02/01/2021   CO2 29 02/01/2021   TSH 1.07 11/11/2020   PSA 4.66 11/18/2020   HGBA1C 5.5 02/01/2021     Assessment & Plan:   Amarian was seen today for annual exam, anemia, hyperlipidemia and rash.  Diagnoses and all orders for this visit:  Chronic hyperglycemia- His A1c is normal. -     Basic metabolic panel; Future -     Hemoglobin A1c; Future -     Hemoglobin A1c -     Basic metabolic panel  Deficiency anemia- I will screen him for vitamin  deficiencies. -     CBC with Differential/Platelet; Future -     Vitamin B12; Future -     IBC + Ferritin; Future -     Vitamin B1; Future -     Reticulocytes; Future -     Folate; Future -     Methylmalonic acid, serum; Future -     Methylmalonic acid, serum -     Folate -     Reticulocytes -     Vitamin B1 -     IBC + Ferritin -     Vitamin B12 -  CBC with Differential/Platelet  Encounter for general adult medical examination with abnormal findings- Exam completed, labs reviewed, he refused all vaccines today, cancer screenings addressed, patient education was given.  Hyperlipidemia with target LDL less than 130- He has a low ASCVD risk score.  Statin therapy is not indicated. -     Lipid panel; Future -     Lipid panel  Intrinsic eczema- I have asked him to stop using the antibiotic cream and to start using a topical steroid. -     triamcinolone cream (KENALOG) 0.5 %; Apply 1 application topically 3 (three) times daily.  Screening for colorectal cancer -     Cologuard  Other orders -     LDL cholesterol, direct  I have discontinued Jarrad J. Ringenberg's ipratropium and nitrofurantoin (macrocrystal-monohydrate). I am also having him start on triamcinolone cream. Additionally, I am having him maintain his tamsulosin.  Meds ordered this encounter  Medications   triamcinolone cream (KENALOG) 0.5 %    Sig: Apply 1 application topically 3 (three) times daily.    Dispense:  90 g    Refill:  2      Follow-up: Return in about 3 months (around 05/03/2021).  Scarlette Calico, MD

## 2021-02-05 LAB — RETICULOCYTES
ABS Retic: 35200 cells/uL (ref 25000–90000)
Retic Ct Pct: 0.8 %

## 2021-02-05 LAB — VITAMIN B1: Vitamin B1 (Thiamine): 18 nmol/L (ref 8–30)

## 2021-02-05 LAB — METHYLMALONIC ACID, SERUM: Methylmalonic Acid, Quant: 176 nmol/L (ref 87–318)

## 2021-02-09 LAB — COLOGUARD

## 2021-02-22 LAB — COLOGUARD: Cologuard: NEGATIVE

## 2021-03-08 ENCOUNTER — Other Ambulatory Visit: Payer: Self-pay

## 2021-03-08 ENCOUNTER — Ambulatory Visit: Payer: Managed Care, Other (non HMO) | Admitting: Internal Medicine

## 2021-03-08 ENCOUNTER — Encounter: Payer: Self-pay | Admitting: Internal Medicine

## 2021-03-08 VITALS — BP 132/80 | HR 73 | Temp 97.8°F | Ht 70.0 in | Wt 163.0 lb

## 2021-03-08 DIAGNOSIS — K4090 Unilateral inguinal hernia, without obstruction or gangrene, not specified as recurrent: Secondary | ICD-10-CM

## 2021-03-08 DIAGNOSIS — R739 Hyperglycemia, unspecified: Secondary | ICD-10-CM | POA: Diagnosis not present

## 2021-03-08 NOTE — Patient Instructions (Signed)
Please continue all other medications as before, and refills have been done if requested.  Please have the pharmacy call with any other refills you may need.  Please keep your appointments with your specialists as you may have planned  You will be contacted regarding the referral for: general surgury  Please go to the ED for pain that gets severe and will not resolve on its own

## 2021-03-08 NOTE — Progress Notes (Signed)
Patient ID: Nathan Pearson, male   DOB: 06/23/1955, 65 y.o.   MRN: [id number]        Chief Complaint: follow up left inguinal hernia pain and swelling       HPI:  Nathan Pearson is a 65 y.o. male here with c/o 2 wks onset worsening pain and discomfort at the left inguinal area that he has known LIH, worse swelling during the day with discomfort, then improved with lying down at night. Denies worsening reflux, abd pain, dysphagia, n/v, bowel change or blood.  Pt denies chest pain, increased sob or doe, wheezing, orthopnea, PND, increased LE swelling, palpitations, dizziness or syncope.   Pt denies polydipsia, polyuria, or new focal neuro s/s.         Wt Readings from Last 3 Encounters:  03/08/21 163 lb (73.9 kg)  02/01/21 170 lb (77.1 kg)  11/01/20 183 lb (83 kg)   BP Readings from Last 3 Encounters:  03/08/21 132/80  02/01/21 126/72  11/01/20 123/70         History reviewed. No pertinent past medical history. History reviewed. No pertinent surgical history.  reports that he quit smoking about a year ago. His smoking use included cigarettes. He has never used smokeless tobacco. He reports that he does not currently use alcohol. He reports that he does not currently use drugs. family history includes Lung cancer in his mother; Pneumonia in his father. Allergies  Allergen Reactions   Penicillins    Current Outpatient Medications on File Prior to Visit  Medication Sig Dispense Refill   tamsulosin (FLOMAX) 0.4 MG CAPS capsule Take 0.4 mg by mouth daily.     triamcinolone cream (KENALOG) 0.5 % Apply 1 application topically 3 (three) times daily. 90 g 2   No current facility-administered medications on file prior to visit.        ROS:  All others reviewed and negative.  Objective        PE:  BP 132/80 (BP Location: Left Arm, Patient Position: Sitting, Cuff Size: Normal)   Pulse 73   Temp 97.8 F (36.6 C) (Oral)   Ht 5\' 10"  (1.778 m)   Wt 163 lb (73.9 kg)   SpO2 99%   BMI  23.39 kg/m                 Constitutional: Pt appears in NAD               HENT: Head: NCAT.                Right Ear: External ear normal.                 Left Ear: External ear normal.                Eyes: . Pupils are equal, round, and reactive to light. Conjunctivae and EOM are normal               Nose: without d/c or deformity               Neck: Neck supple. Gross normal ROM               Cardiovascular: Normal rate and regular rhythm.                 Pulmonary/Chest: Effort normal and breath sounds without rales or wheezing.                Abd:  Soft, NT, ND, +  BS, no organomegaly; left inguinal area with mild tender LIH swelling and reducible               Neurological: Pt is alert. At baseline orientation, motor grossly intact               Skin: Skin is warm. No rashes, no other new lesions, LE edema - none               Psychiatric: Pt behavior is normal without agitation   Micro: none  Cardiac tracings I have personally interpreted today:  none  Pertinent Radiological findings (summarize): none   Lab Results  Component Value Date   WBC 5.7 02/01/2021   HGB 13.0 02/01/2021   HCT 38.0 (L) 02/01/2021   PLT 235.0 02/01/2021   GLUCOSE 91 02/01/2021   CHOL 151 02/01/2021   TRIG 217.0 (H) 02/01/2021   HDL 51.70 02/01/2021   LDLDIRECT 81.0 02/01/2021   NA 144 02/01/2021   K 4.2 02/01/2021   CL 108 02/01/2021   CREATININE 0.92 02/01/2021   BUN 16 02/01/2021   CO2 29 02/01/2021   TSH 1.07 11/11/2020   PSA 4.66 11/18/2020   HGBA1C 5.5 02/01/2021   Assessment/Plan:  Nathan Pearson is a 65 y.o. White or Caucasian [1] male with  has no past medical history on file.  Left inguinal hernia With 2 wks onset increased symptoms but nontoxic and reducible - for referral general surgury  Chronic hyperglycemia Lab Results  Component Value Date   HGBA1C 5.5 02/01/2021   Stable, pt to continue current medical treatment  - diet  Followup: Return if symptoms worsen or  fail to improve.  Cathlean Cower, MD 03/15/2021 7:57 PM [facility]

## 2021-03-15 ENCOUNTER — Encounter: Payer: Self-pay | Admitting: Internal Medicine

## 2021-03-15 DIAGNOSIS — K4090 Unilateral inguinal hernia, without obstruction or gangrene, not specified as recurrent: Secondary | ICD-10-CM | POA: Insufficient documentation

## 2021-03-15 NOTE — Assessment & Plan Note (Signed)
With 2 wks onset increased symptoms but nontoxic and reducible - for referral general surgury

## 2021-03-15 NOTE — Assessment & Plan Note (Signed)
Lab Results  Component Value Date   HGBA1C 5.5 02/01/2021   Stable, pt to continue current medical treatment  - diet

## 2021-03-30 ENCOUNTER — Telehealth: Payer: Self-pay | Admitting: Internal Medicine

## 2021-03-30 NOTE — Telephone Encounter (Signed)
Lilia Pro from [facility] called the patient today to schedule his surgical appointment.  Patient answered the phone, but was not able to speak, and then hung up the phone Patient declined emergency contacts, so no one to get in touch with, also according to the 11.11.22 telephone note, he may be moving, so not sure if appointment is needed  Morgan's number:

## 2022-01-13 DEATH — deceased

## 2022-12-09 IMAGING — CT CT NECK W/ CM
3 of 4 series · 6 of 14 positions shown, 7 images · IV contrast (iopamidol)
Comparison: None.

CLINICAL DATA: Neck mass on the right side for several years

EXAM:
CT NECK WITH CONTRAST
TECHNIQUE: Multidetector CT imaging of the neck was performed using the
standard protocol following the bolus administration of intravenous
contrast.
CONTRAST:  75mL SCZKYN-CXX IOPAMIDOL (SCZKYN-CXX) INJECTION 61%

[Series 3: neck · axial · 0.55mm/px · z∈[-257,-169]mm · 2 of 133 slices shown]
[im 45/133  bone]
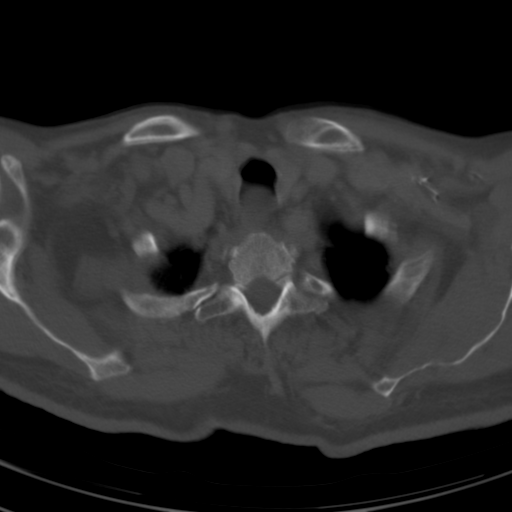
[im 89/133  bone]
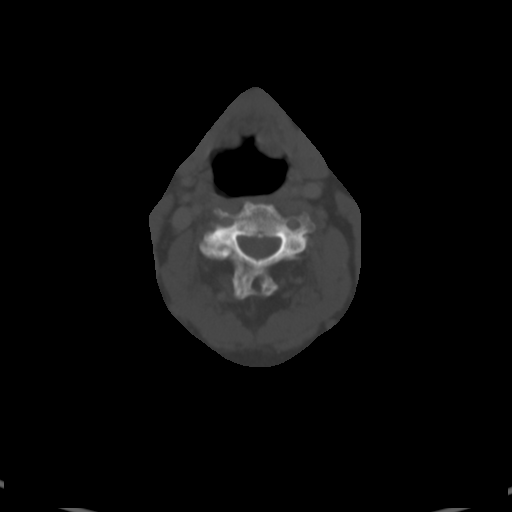

[Series 8: angled axial-oropharynx · axial · 0.52mm/px · z∈[-276,-190]mm · 2 of 134 slices shown, 3 images]
[im 45/134  soft-tissue]
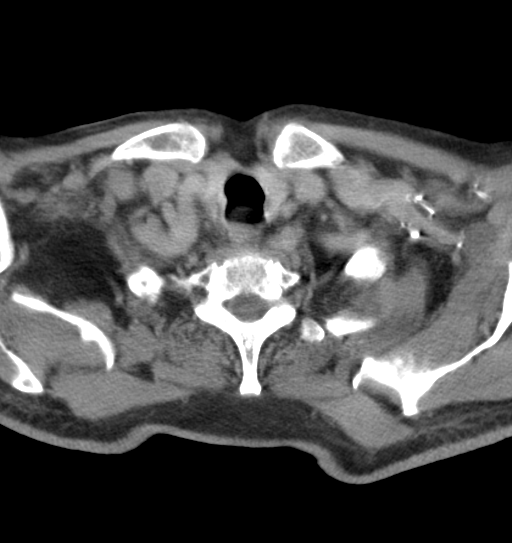
[im 45/134  bone]
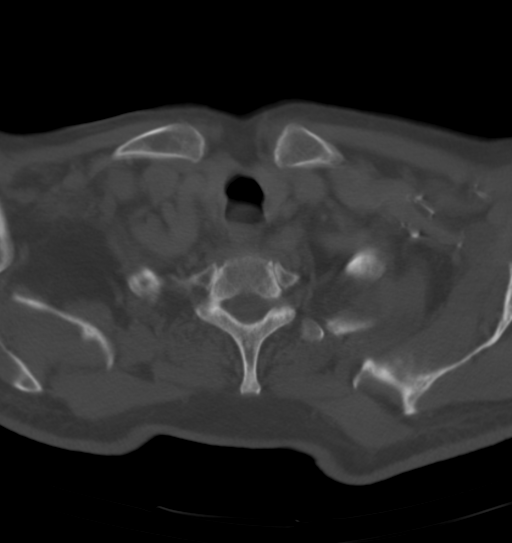
[im 89/134  bone]
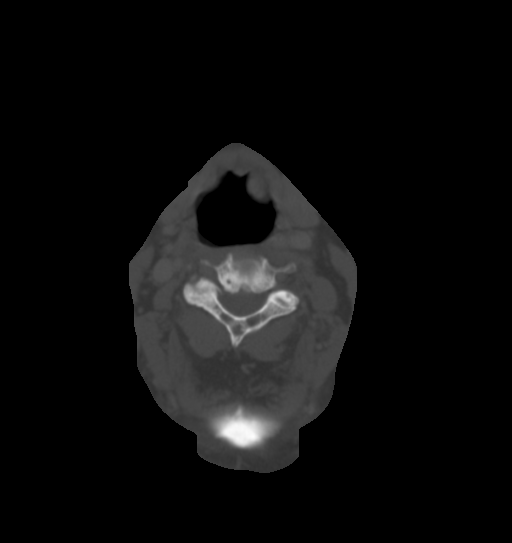

[Series 9: angled (person_name) · axial · 0.52mm/px · z∈[-213,-128]mm · 2 of 134 slices shown]
[im 45/134  bone]
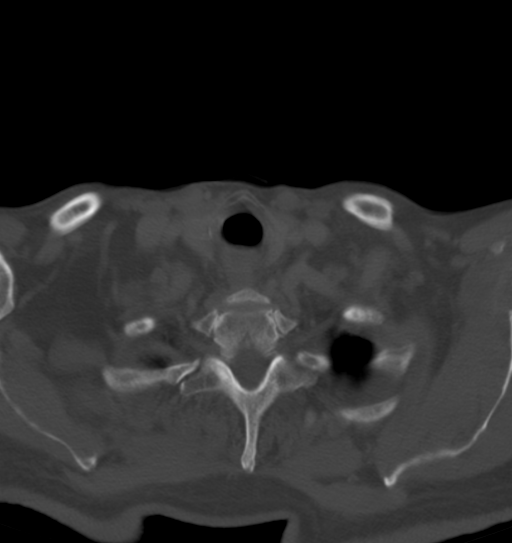
[im 89/134  bone]
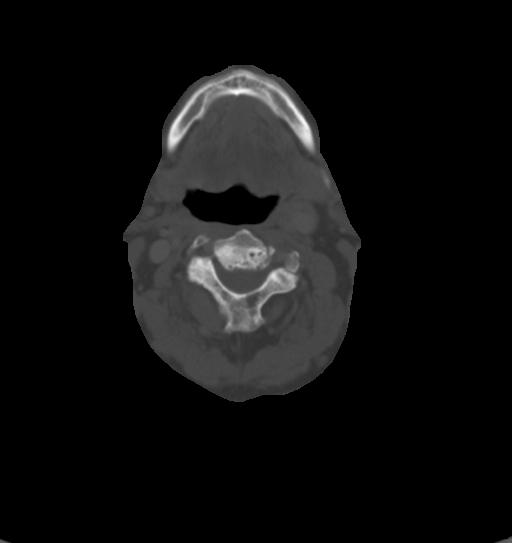

[6 of 14 positions shown; findings below may reference images not displayed]

FINDINGS: Pharynx and larynx: No evidence of mass or inflammation. Patulous
pharynx.

Salivary glands: No inflammation, mass, or stone.

Thyroid: Unremarkable

Lymph nodes: None enlarged or abnormal density.

Vascular: Atheromatous calcification at the carotid bifurcations.

Limited intracranial: Unremarkable

Visualized orbits: Not covered

Mastoids and visualized paranasal sinuses: Clear

Skeleton: Generalized cervical disc and facet degeneration.
Low-density posteriorly projecting structure rightward at the
atlantodental interval likely reflecting degenerative ganglion,
contacting but not compressing the cervicomedullary junction.

Upper chest: Negative

Other: Generalized motion artifact. Subcutaneous scar-like
appearance in the posterior and upper right neck.
IMPRESSION: No mass or adenopathy seen at the right palpable marker. The marker
overlaps the lower right submandibular gland, carotid bifurcation,
and right hyoid.

## 2022-12-12 IMAGING — DX DG CHEST 2V
2 series · 2 of 2 positions shown · non-contrast
Comparison: None.

CLINICAL DATA: 65-year-old male with a history of cough

EXAM:
CHEST - 2 VIEW

[chest pa]
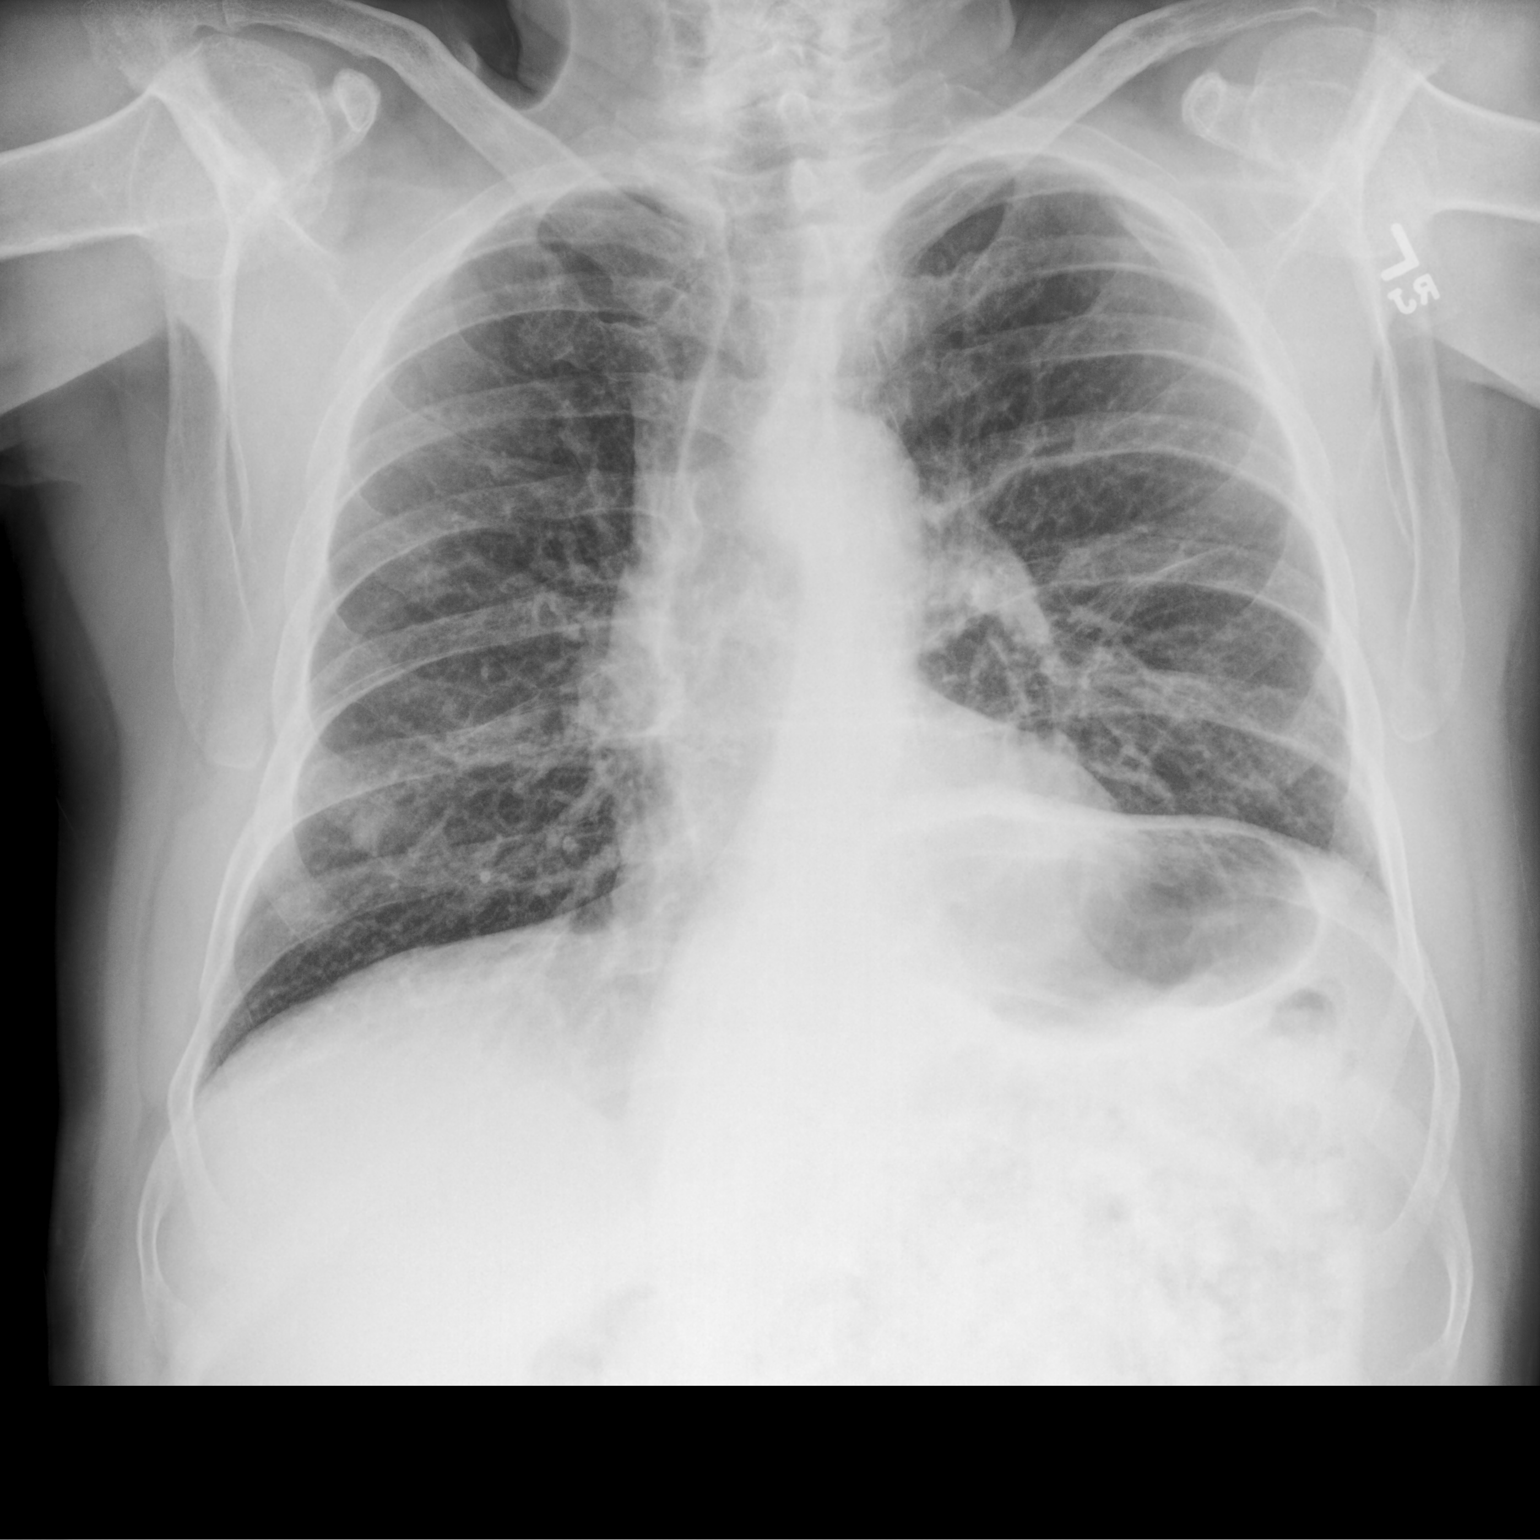

[chest lat]
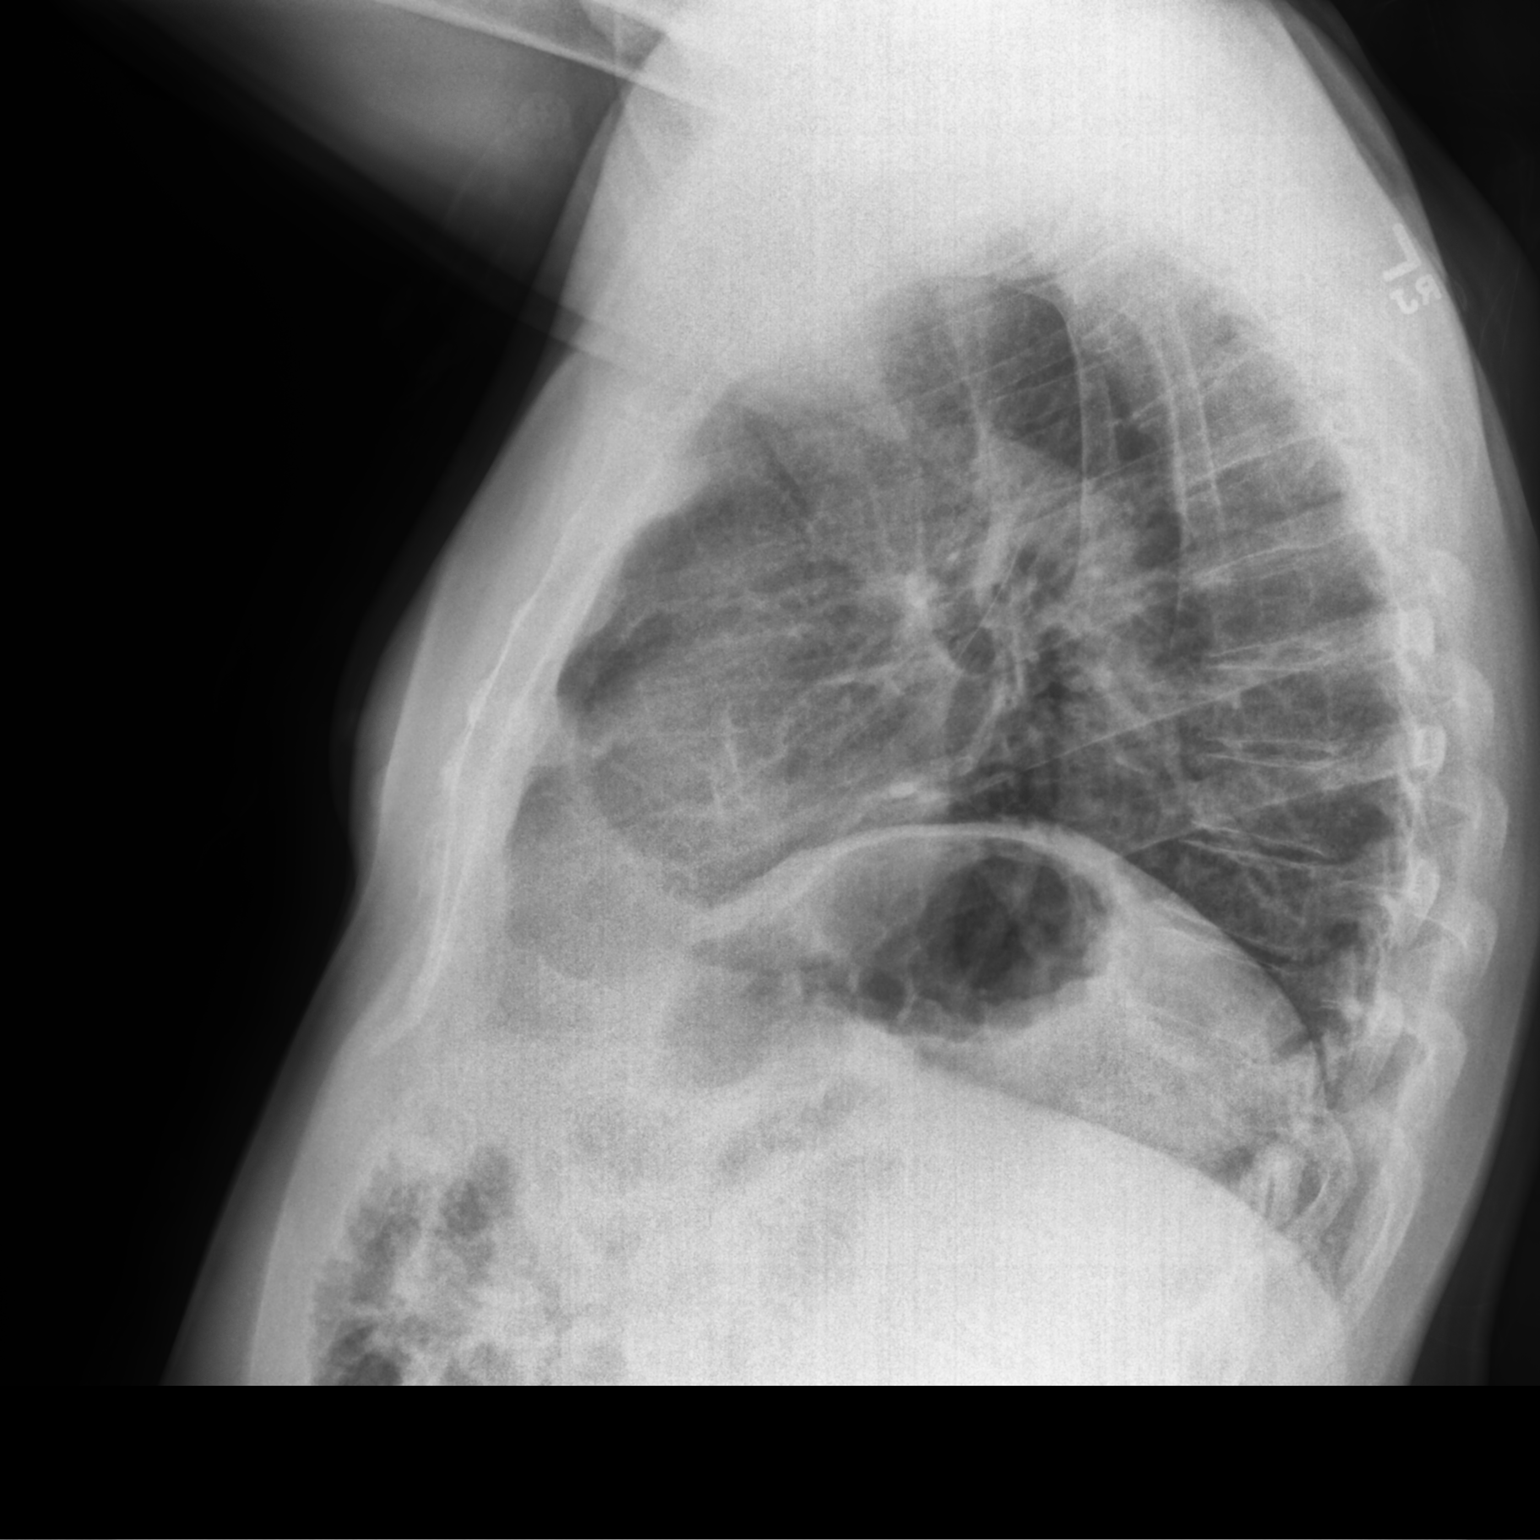

[2 of 2 positions shown; findings below may reference images not displayed]

FINDINGS: Cardiomediastinal silhouette within normal limits. Tortuosity of the
thoracic aorta. No central vascular congestion.

Low lung volumes. Reticulonodular opacities in the lungs with no
comparison.

No pneumothorax or pleural effusion.

No displaced fracture
IMPRESSION: Low lung volumes with reticulonodular opacities and no comparison.
Differential includes atypical infection versus chronic
changes/scarring.
